# Patient Record
Sex: Male | Born: 1995 | Race: Black or African American | Hispanic: No | Marital: Single | Smoking: Never smoker
Health system: Southern US, Community
[De-identification: ages and names within clinical notes are randomized; demographics above are authoritative.]

## PROBLEM LIST (undated history)

## (undated) DIAGNOSIS — F32A Depression, unspecified: Secondary | ICD-10-CM

## (undated) DIAGNOSIS — J45909 Unspecified asthma, uncomplicated: Secondary | ICD-10-CM

## (undated) DIAGNOSIS — R569 Unspecified convulsions: Secondary | ICD-10-CM

## (undated) DIAGNOSIS — F319 Bipolar disorder, unspecified: Secondary | ICD-10-CM

## (undated) DIAGNOSIS — F909 Attention-deficit hyperactivity disorder, unspecified type: Secondary | ICD-10-CM

## (undated) HISTORY — DX: Depression, unspecified: F32.A

## (undated) HISTORY — PX: ADENOIDECTOMY: SUR15

## (undated) HISTORY — PX: TONSILLECTOMY: SUR1361

## (undated) HISTORY — PX: EYE SURGERY: SHX253

---
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
## 2019-07-28 ENCOUNTER — Ambulatory Visit: Payer: Medicaid Other

## 2022-04-02 ENCOUNTER — Encounter: Payer: Self-pay | Admitting: Nurse Practitioner

## 2022-04-02 ENCOUNTER — Ambulatory Visit: Payer: Medicaid Other | Admitting: Nurse Practitioner

## 2022-04-02 VITALS — BP 141/68 | HR 63 | Temp 98.8°F | Ht 67.0 in | Wt 207.0 lb

## 2022-04-02 DIAGNOSIS — R030 Elevated blood-pressure reading, without diagnosis of hypertension: Secondary | ICD-10-CM

## 2022-04-02 DIAGNOSIS — F321 Major depressive disorder, single episode, moderate: Secondary | ICD-10-CM | POA: Diagnosis not present

## 2022-04-02 DIAGNOSIS — F419 Anxiety disorder, unspecified: Secondary | ICD-10-CM | POA: Diagnosis not present

## 2022-04-02 NOTE — Assessment & Plan Note (Signed)
For elevated blood pressure, life style changes recommended, low sodium diet, exersise and weight loss, daily blood pressure log , follow up in 1 month.

## 2022-04-02 NOTE — Assessment & Plan Note (Signed)
Patient is reporting worsening anxiety and depression. Patient has had to deaths in the family, his uncle and grandfather. Patient may be dealing with complicated grief. Patient was told he needed to "man Up" he is not processing his new role well as he is the only male left in his family and feels the pressure of every one looking up to him.  Completed GAD-7 and PHQ-9, provided listening and advised patient to enrol in grief counseling. Patient doesn't not want to start any medication today and will return if his symptoms are not improving. Education provided to patient with printed hand out.  

## 2022-04-02 NOTE — Progress Notes (Signed)
New Patient Note  RE: Jackson Jones MRN: [id number] DOB: [birth year] Date of Office Visit: 04/02/2022  Chief Complaint: Establish Care, Depression, Anxiety, and Hypertension  History of Present Illness: Hypertension: Patient here for follow-up of elevated blood pressure. He is exercising and is not adherent to low salt diet.  Blood pressure is not well controlled at home. Cardiac symptoms none. Patient denies exertional chest pressure/discomfort, fatigue, irregular heart beat, lower extremity edema, and near-syncope.  Cardiovascular risk factors: male gender. Use of agents associated with hypertension: none. History of target organ damage: none.   Anxiety: Patient complains of anxiety disorder.  He has the following symptoms: difficulty concentrating, feelings of losing control. Onset of symptoms was approximately a few months ago, rapidly worsening since that time. He denies current suicidal and homicidal ideation. Family history significant for depression.Possible organic causes contributing are: none. Risk factors: none Previous treatment includes  none  and none.  He complains of the following side effects from the treatment: not applicable. Marland Kitchen    GAD-7 Results    04/02/2022    3:12 PM  GAD-7 Generalized Anxiety Disorder Screening Tool  1. Feeling Nervous, Anxious, or on Edge 2  2. Not Being Able to Stop or Control Worrying 2  3. Worrying Too Much About Different Things 2  4. Trouble Relaxing 2  5. Being So Restless it's Hard To Sit Still 0  6. Becoming Easily Annoyed or Irritable 2  7. Feeling Afraid As If Something Awful Might Happen 2  Total GAD-7 Score 12    PHQ-9 Scores    04/02/2022    3:11 PM 04/02/2022    2:52 PM  PHQ9 SCORE ONLY  PHQ-9 Total Score 14 0        04/02/2022    3:11 PM 04/02/2022    2:52 PM  Depression screen PHQ 2/9  Decreased Interest 2 0  Down, Depressed, Hopeless 2 0  PHQ - 2 Score 4 0  Altered sleeping 2 0  Tired, decreased energy 2 0   Change in appetite 0 0  Feeling bad or failure about yourself  2 0  Trouble concentrating 2 0  Moving slowly or fidgety/restless 0 0  Suicidal thoughts 2 0  PHQ-9 Score 14 0  Difficult doing work/chores Somewhat difficult Not difficult at all    Assessment and Plan: Jackson Jones is a 26 y.o. male with: Anxiety Patient is reporting worsening anxiety and depression. Patient has had to deaths in the family, his uncle and grandfather. Patient may be dealing with complicated grief. Patient was told he needed to "man Up" he is not processing his new role well as he is the only male left in his family and feels the pressure of every one looking up to him.  Completed GAD-7 and PHQ-9, provided listening and advised patient to enrol in grief counseling. Patient doesn't not want to start any medication today and will return if his symptoms are not improving. Education provided to patient with printed hand out.   Depression, major, single episode, moderate (HCC) Patient is reporting worsening anxiety and depression. Patient has had to deaths in the family, his uncle and grandfather. Patient may be dealing with complicated grief. Patient was told he needed to "man Up" he is not processing his new role well as he is the only male left in his family and feels the pressure of every one looking up to him.  Completed GAD-7 and PHQ-9, provided listening and advised patient to enrol in grief counseling. Patient  doesn't not want to start any medication today and will return if his symptoms are not improving. Education provided to patient with printed hand out.   Elevated blood pressure reading For elevated blood pressure, life style changes recommended, low sodium diet, exersise and weight loss, daily blood pressure log , follow up in 1 month.   Return in about 1 month (around 05/02/2022) for hypertension.   Diagnostics:   Past Medical History: Patient Active Problem List   Diagnosis Date Noted   Anxiety  04/02/2022   Depression, major, single episode, moderate (HCC) 04/02/2022   Elevated blood pressure reading 04/02/2022   Past Medical History:  Diagnosis Date   ADHD (attention deficit hyperactivity disorder)    Asthma    Bipolar 1 disorder (HCC)    Depression    Seizures (HCC)    Past Surgical History: Past Surgical History:  Procedure Laterality Date   ADENOIDECTOMY     EYE SURGERY     TONSILLECTOMY     Medication List:  Current Outpatient Medications  Medication Sig Dispense Refill   albuterol (PROVENTIL HFA;VENTOLIN HFA) 108 (90 BASE) MCG/ACT inhaler Inhale 2 puffs into the lungs every 6 (six) hours as needed. Shortness of breath. (Patient not taking: Reported on 04/02/2022)     albuterol (PROVENTIL) (2.5 MG/3ML) 0.083% nebulizer solution Take 2.5 mg by nebulization every 6 (six) hours as needed. Shortness of breath. (Patient not taking: Reported on 04/02/2022)     amLODipine (NORVASC) 5 MG tablet Take by mouth. (Patient not taking: Reported on 04/02/2022)     cefdinir (OMNICEF) 300 MG capsule Take 600 mg by mouth 2 (two) times daily. (Patient not taking: Reported on 04/02/2022)     No current facility-administered medications for this visit.   Allergies: Allergies  Allergen Reactions   Codeine Shortness Of Breath    bronchiolar spasms.   Social History: Social History   Socioeconomic History   Marital status: Single    Spouse name: Not on file   Number of children: Not on file   Years of education: Not on file   Highest education level: High school graduate  Occupational History   Not on file  Tobacco Use   Smoking status: Never   Smokeless tobacco: Not on file  Vaping Use   Vaping Use: Never used  Substance and Sexual Activity   Alcohol use: No   Drug use: No   Sexual activity: Yes  Other Topics Concern   Not on file  Social History Narrative   Not on file   Social Determinants of Health   Financial Resource Strain: Not on file  Food Insecurity:  Not on file  Transportation Needs: Not on file  Physical Activity: Not on file  Stress: Not on file  Social Connections: Not on file       Family History: Family History  Problem Relation Age of Onset   Asthma Sister    Cancer Paternal Grandmother    Asthma Paternal Grandmother          Review of Systems  Constitutional: Negative.   HENT: Negative.    Eyes: Negative.   Respiratory: Negative.    Cardiovascular: Negative.   Gastrointestinal: Negative.   Genitourinary: Negative.   Musculoskeletal: Negative.   Neurological: Negative.   Psychiatric/Behavioral:  The patient is nervous/anxious.   All other systems reviewed and are negative.  Objective: BP (!) 141/68   Pulse 63   Temp 98.8 F (37.1 C)   Ht 5\' 7"  (1.702 m)  Wt 207 lb (93.9 kg)   SpO2 100%   BMI 32.42 kg/m  Body mass index is 32.42 kg/m. Physical Exam Vitals and nursing note reviewed.  Constitutional:      Appearance: Normal appearance.  HENT:     Head: Normocephalic.     Right Ear: External ear normal. There is no impacted cerumen.     Left Ear: External ear normal.     Nose: Nose normal.  Eyes:     Conjunctiva/sclera: Conjunctivae normal.     Pupils: Pupils are equal, round, and reactive to light.  Cardiovascular:     Pulses: Normal pulses.     Heart sounds: Normal heart sounds.  Pulmonary:     Effort: Pulmonary effort is normal.     Breath sounds: Normal breath sounds.  Abdominal:     General: Bowel sounds are normal. There is no distension.  Skin:    General: Skin is warm.  Neurological:     General: No focal deficit present.     Mental Status: He is alert and oriented to person, place, and time.  Psychiatric:        Mood and Affect: Mood is anxious and depressed.        Behavior: Behavior normal.    The plan was reviewed with the patient/family, and all questions/concerned were addressed.  It was my pleasure to see Kaydyn today and participate in his care. Please feel free to  contact me with any questions or concerns.  Sincerely,  Lynnell Chad NP [facility]

## 2022-04-02 NOTE — Assessment & Plan Note (Signed)
Patient is reporting worsening anxiety and depression. Patient has had to deaths in the family, his uncle and grandfather. Patient may be dealing with complicated grief. Patient was told he needed to "man Up" he is not processing his new role well as he is the only male left in his family and feels the pressure of every one looking up to him.  Completed GAD-7 and PHQ-9, provided listening and advised patient to enrol in grief counseling. Patient doesn't not want to start any medication today and will return if his symptoms are not improving. Education provided to patient with printed hand out.

## 2022-04-02 NOTE — Patient Instructions (Signed)
Hypertension, Adult ?Hypertension is another name for high blood pressure. High blood pressure forces your heart to work harder to pump blood. This can cause problems over time. ?There are two numbers in a blood pressure reading. There is a top number (systolic) over a bottom number (diastolic). It is best to have a blood pressure that is below 120/80. ?What are the causes? ?The cause of this condition is not known. Some other conditions can lead to high blood pressure. ?What increases the risk? ?Some lifestyle factors can make you more likely to develop high blood pressure: ?Smoking. ?Not getting enough exercise or physical activity. ?Being overweight. ?Having too much fat, sugar, calories, or salt (sodium) in your diet. ?Drinking too much alcohol. ?Other risk factors include: ?Having any of these conditions: ?Heart disease. ?Diabetes. ?High cholesterol. ?Kidney disease. ?Obstructive sleep apnea. ?Having a family history of high blood pressure and high cholesterol. ?Age. The risk increases with age. ?Stress. ?What are the signs or symptoms? ?High blood pressure may not cause symptoms. Very high blood pressure (hypertensive crisis) may cause: ?Headache. ?Fast or uneven heartbeats (palpitations). ?Shortness of breath. ?Nosebleed. ?Vomiting or feeling like you may vomit (nauseous). ?Changes in how you see. ?Very bad chest pain. ?Feeling dizzy. ?Seizures. ?How is this treated? ?This condition is treated by making healthy lifestyle changes, such as: ?Eating healthy foods. ?Exercising more. ?Drinking less alcohol. ?Your doctor may prescribe medicine if lifestyle changes do not help enough and if: ?Your top number is above 130. ?Your bottom number is above 80. ?Your personal target blood pressure may vary. ?Follow these instructions at home: ?Eating and drinking ? ?If told, follow the DASH eating plan. To follow this plan: ?Fill one half of your plate at each meal with fruits and vegetables. ?Fill one fourth of your plate  at each meal with whole grains. Whole grains include whole-wheat pasta, brown rice, and whole-grain bread. ?Eat or drink low-fat dairy products, such as skim milk or low-fat yogurt. ?Fill one fourth of your plate at each meal with low-fat (lean) proteins. Low-fat proteins include fish, chicken without skin, eggs, beans, and tofu. ?Avoid fatty meat, cured and processed meat, or chicken with skin. ?Avoid pre-made or processed food. ?Limit the amount of salt in your diet to less than 1,500 mg each day. ?Do not drink alcohol if: ?Your doctor tells you not to drink. ?You are pregnant, may be pregnant, or are planning to become pregnant. ?If you drink alcohol: ?Limit how much you have to: ?0-1 drink a day for women. ?0-2 drinks a day for men. ?Know how much alcohol is in your drink. In the [place] one drink equals one 12 oz bottle of beer (355 mL), one 5 oz glass of wine (148 mL), or one 1? oz glass of hard liquor (44 mL). ?Lifestyle ? ?Work with your doctor to stay at a healthy weight or to lose weight. Ask your doctor what the best weight is for you. ?Get at least 30 minutes of exercise that causes your heart to beat faster (aerobic exercise) most days of the week. This may include walking, swimming, or biking. ?Get at least 30 minutes of exercise that strengthens your muscles (resistance exercise) at least 3 days a week. This may include lifting weights or doing Pilates. ?Do not smoke or use any products that contain nicotine or tobacco. If you need help quitting, ask your doctor. ?Check your blood pressure at home as told by your doctor. ?Keep all follow-up visits. ?Medicines ?Take over-the-counter and prescription medicines   only as told by your doctor. Follow directions carefully. ?Do not skip doses of blood pressure medicine. The medicine does not work as well if you skip doses. Skipping doses also puts you at risk for problems. ?Ask your doctor about side effects or reactions to medicines that you should watch  for. ?Contact a doctor if: ?You think you are having a reaction to the medicine you are taking. ?You have headaches that keep coming back. ?You feel dizzy. ?You have swelling in your ankles. ?You have trouble with your vision. ?Get help right away if: ?You get a very bad headache. ?You start to feel mixed up (confused). ?You feel weak or numb. ?You feel faint. ?You have very bad pain in your: ?Chest. ?Belly (abdomen). ?You vomit more than once. ?You have trouble breathing. ?These symptoms may be an emergency. Get help right away. Call 911. ?Do not wait to see if the symptoms will go away. ?Do not drive yourself to the hospital. ?Summary ?Hypertension is another name for high blood pressure. ?High blood pressure forces your heart to work harder to pump blood. ?For most people, a normal blood pressure is less than 120/80. ?Making healthy choices can help lower blood pressure. If your blood pressure does not get lower with healthy choices, you may need to take medicine. ?This information is not intended to replace advice given to you by your health care provider. Make sure you discuss any questions you have with your health care provider. ?Document Revised: 02/07/2021 Document Reviewed: 02/07/2021 ?Elsevier Patient Education ? 2023 [employer] ? ?

## 2022-04-03 LAB — CMP14+EGFR
ALT: 34 IU/L (ref 0–44)
AST: 25 IU/L (ref 0–40)
Albumin/Globulin Ratio: 1.9 (ref 1.2–2.2)
Albumin: 4.9 g/dL (ref 4.3–5.2)
Alkaline Phosphatase: 72 IU/L (ref 44–121)
BUN/Creatinine Ratio: 8 — ABNORMAL LOW (ref 9–20)
BUN: 11 mg/dL (ref 6–20)
Bilirubin Total: 0.6 mg/dL (ref 0.0–1.2)
CO2: 23 mmol/L (ref 20–29)
Calcium: 10.2 mg/dL (ref 8.7–10.2)
Chloride: 101 mmol/L (ref 96–106)
Creatinine, Ser: 1.33 mg/dL — ABNORMAL HIGH (ref 0.76–1.27)
Globulin, Total: 2.6 g/dL (ref 1.5–4.5)
Glucose: 89 mg/dL (ref 70–99)
Potassium: 4.8 mmol/L (ref 3.5–5.2)
Sodium: 140 mmol/L (ref 134–144)
Total Protein: 7.5 g/dL (ref 6.0–8.5)
eGFR: 76 mL/min/{1.73_m2} (ref >59)

## 2022-04-03 LAB — LIPID PANEL
Chol/HDL Ratio: 2.7 ratio (ref 0.0–5.0)
Cholesterol, Total: 148 mg/dL (ref 100–199)
HDL: 54 mg/dL (ref >39)
LDL Chol Calc (NIH): 82 mg/dL (ref 0–99)
Triglycerides: 58 mg/dL (ref 0–149)
VLDL Cholesterol Cal: 12 mg/dL (ref 5–40)

## 2022-04-03 LAB — CBC WITH DIFFERENTIAL/PLATELET
Basophils Absolute: 0 10*3/uL (ref 0.0–0.2)
Basos: 0 %
EOS (ABSOLUTE): 0.1 10*3/uL (ref 0.0–0.4)
Eos: 1 %
Hematocrit: 45.2 % (ref 37.5–51.0)
Hemoglobin: 15.1 g/dL (ref 13.0–17.7)
Immature Grans (Abs): 0 10*3/uL (ref 0.0–0.1)
Immature Granulocytes: 0 %
Lymphocytes Absolute: 2.2 10*3/uL (ref 0.7–3.1)
Lymphs: 33 %
MCH: 29.9 pg (ref 26.6–33.0)
MCHC: 33.4 g/dL (ref 31.5–35.7)
MCV: 90 fL (ref 79–97)
Monocytes Absolute: 0.5 10*3/uL (ref 0.1–0.9)
Monocytes: 8 %
Neutrophils Absolute: 3.9 10*3/uL (ref 1.4–7.0)
Neutrophils: 58 %
Platelets: 246 10*3/uL (ref 150–450)
RBC: 5.05 x10E6/uL (ref 4.14–5.80)
RDW: 11.5 % — ABNORMAL LOW (ref 11.6–15.4)
WBC: 6.6 10*3/uL (ref 3.4–10.8)

## 2023-04-30 NOTE — Progress Notes (Signed)
 Patient returns due to  positive Gonorrhea test. Here for Rocephin 1g IM injection. Patient asked for something for pain like Tylenol . Treated with  Tylenol  1  500 mg tablet. He also would like his  AVS information printed  Educated patient to notify any partners.
